# Patient Record
Sex: Female | Born: 1945 | Race: White | Hispanic: No | Marital: Married | Smoking: Former smoker
Health system: Southern US, Community
[De-identification: ages and names within clinical notes are randomized; demographics above are authoritative.]

## PROBLEM LIST (undated history)

## (undated) DIAGNOSIS — E079 Disorder of thyroid, unspecified: Secondary | ICD-10-CM

## (undated) DIAGNOSIS — E7800 Pure hypercholesterolemia, unspecified: Secondary | ICD-10-CM

## (undated) DIAGNOSIS — M810 Age-related osteoporosis without current pathological fracture: Secondary | ICD-10-CM

## (undated) HISTORY — PX: ABDOMINAL HYSTERECTOMY: SHX81

## (undated) HISTORY — PX: CHOLECYSTECTOMY: SHX55

## (undated) HISTORY — PX: PARATHYROIDECTOMY: SHX19

---

## 1997-12-25 ENCOUNTER — Other Ambulatory Visit: Admission: RE | Admit: 1997-12-25 | Discharge: 1997-12-25 | Payer: Self-pay | Admitting: Cardiology

## 1999-03-27 ENCOUNTER — Other Ambulatory Visit: Admission: RE | Admit: 1999-03-27 | Discharge: 1999-03-27 | Payer: Self-pay | Admitting: *Deleted

## 2000-01-13 ENCOUNTER — Encounter (INDEPENDENT_AMBULATORY_CARE_PROVIDER_SITE_OTHER): Payer: Self-pay | Admitting: Specialist

## 2000-01-13 ENCOUNTER — Other Ambulatory Visit: Admission: RE | Admit: 2000-01-13 | Discharge: 2000-01-13 | Payer: Self-pay | Admitting: Gastroenterology

## 2000-03-23 ENCOUNTER — Other Ambulatory Visit: Admission: RE | Admit: 2000-03-23 | Discharge: 2000-03-23 | Payer: Self-pay | Admitting: *Deleted

## 2001-03-21 ENCOUNTER — Other Ambulatory Visit: Admission: RE | Admit: 2001-03-21 | Discharge: 2001-03-21 | Payer: Self-pay | Admitting: *Deleted

## 2002-03-23 ENCOUNTER — Other Ambulatory Visit: Admission: RE | Admit: 2002-03-23 | Discharge: 2002-03-23 | Payer: Self-pay | Admitting: *Deleted

## 2003-04-17 ENCOUNTER — Other Ambulatory Visit: Admission: RE | Admit: 2003-04-17 | Discharge: 2003-04-17 | Payer: Self-pay | Admitting: *Deleted

## 2004-04-24 ENCOUNTER — Other Ambulatory Visit: Admission: RE | Admit: 2004-04-24 | Discharge: 2004-04-24 | Payer: Self-pay | Admitting: *Deleted

## 2004-12-23 ENCOUNTER — Ambulatory Visit: Payer: Self-pay | Admitting: Gastroenterology

## 2004-12-30 ENCOUNTER — Ambulatory Visit: Payer: Self-pay | Admitting: Gastroenterology

## 2004-12-30 ENCOUNTER — Encounter (INDEPENDENT_AMBULATORY_CARE_PROVIDER_SITE_OTHER): Payer: Self-pay | Admitting: Specialist

## 2005-04-27 ENCOUNTER — Other Ambulatory Visit: Admission: RE | Admit: 2005-04-27 | Discharge: 2005-04-27 | Payer: Self-pay | Admitting: *Deleted

## 2005-11-25 ENCOUNTER — Emergency Department (HOSPITAL_COMMUNITY): Admission: EM | Admit: 2005-11-25 | Discharge: 2005-11-25 | Payer: Self-pay | Admitting: Emergency Medicine

## 2006-04-27 ENCOUNTER — Other Ambulatory Visit: Admission: RE | Admit: 2006-04-27 | Discharge: 2006-04-27 | Payer: Self-pay | Admitting: *Deleted

## 2007-05-02 ENCOUNTER — Other Ambulatory Visit: Admission: RE | Admit: 2007-05-02 | Discharge: 2007-05-02 | Payer: Self-pay | Admitting: *Deleted

## 2008-05-02 ENCOUNTER — Other Ambulatory Visit: Admission: RE | Admit: 2008-05-02 | Discharge: 2008-05-02 | Payer: Self-pay | Admitting: Gynecology

## 2008-11-12 ENCOUNTER — Encounter (INDEPENDENT_AMBULATORY_CARE_PROVIDER_SITE_OTHER): Payer: Self-pay | Admitting: *Deleted

## 2009-05-06 ENCOUNTER — Other Ambulatory Visit: Admission: RE | Admit: 2009-05-06 | Discharge: 2009-05-06 | Payer: Self-pay | Admitting: Obstetrics and Gynecology

## 2009-08-15 ENCOUNTER — Telehealth (INDEPENDENT_AMBULATORY_CARE_PROVIDER_SITE_OTHER): Payer: Self-pay | Admitting: *Deleted

## 2009-08-29 ENCOUNTER — Telehealth (INDEPENDENT_AMBULATORY_CARE_PROVIDER_SITE_OTHER): Payer: Self-pay | Admitting: *Deleted

## 2009-09-05 ENCOUNTER — Emergency Department (HOSPITAL_COMMUNITY)
Admission: EM | Admit: 2009-09-05 | Discharge: 2009-09-05 | Payer: Self-pay | Source: Home / Self Care | Admitting: Emergency Medicine

## 2009-09-23 ENCOUNTER — Encounter (INDEPENDENT_AMBULATORY_CARE_PROVIDER_SITE_OTHER): Payer: Self-pay | Admitting: General Surgery

## 2009-09-23 ENCOUNTER — Ambulatory Visit (HOSPITAL_COMMUNITY): Admission: AD | Admit: 2009-09-23 | Discharge: 2009-09-24 | Payer: Self-pay | Admitting: General Surgery

## 2010-09-02 NOTE — Progress Notes (Signed)
Summary: Schedule Colonoscopy  Phone Note Outgoing Call Call back at [facility] Phone [phone]   Call placed by: Harlow Mares CMA Duncan Dull),  August 29, 2009 3:15 PM Call placed to: Patient Summary of Call: patient changed practices.  Initial call taken by: Harlow Mares CMA Duncan Dull),  August 29, 2009 3:16 PM

## 2010-09-02 NOTE — Progress Notes (Signed)
Summary: Medical Record release received   Medical Record release received from patient. Mailed 2006 and 2004 [employer] and Path reports to patient to [address] [place], [place] [address]. Wilder Glade  August 15, 2009 12:52 PM  Appended Document: Medical Record release received  address is [address] [place] [address]

## 2010-10-22 LAB — COMPREHENSIVE METABOLIC PANEL
Alkaline Phosphatase: 87 U/L (ref 39–117)
CO2: 28 mEq/L (ref 19–32)
Calcium: 9.8 mg/dL (ref 8.4–10.5)
Chloride: 103 mEq/L (ref 96–112)
GFR calc non Af Amer: >60 mL/min (ref >60)
Glucose, Bld: 183 mg/dL — ABNORMAL HIGH (ref 70–99)
Potassium: 4 mEq/L (ref 3.5–5.1)
Total Bilirubin: 0.2 mg/dL — ABNORMAL LOW (ref 0.3–1.2)

## 2010-10-22 LAB — URINALYSIS, ROUTINE W REFLEX MICROSCOPIC
Glucose, UA: NEGATIVE mg/dL
Hgb urine dipstick: NEGATIVE
pH: 6 (ref 5.0–8.0)

## 2010-10-22 LAB — CBC
Hemoglobin: 13.3 g/dL (ref 12.0–15.0)
Platelets: 227 10*3/uL (ref 150–400)
RDW: 13.4 % (ref 11.5–15.5)

## 2010-10-22 LAB — DIFFERENTIAL
Basophils Absolute: 0 10*3/uL (ref 0.0–0.1)
Basophils Relative: 0 % (ref 0–1)
Lymphs Abs: 1 10*3/uL (ref 0.7–4.0)
Neutrophils Relative %: 88 % — ABNORMAL HIGH (ref 43–77)

## 2010-10-23 LAB — CBC
MCHC: 34.3 g/dL (ref 30.0–36.0)
Platelets: 216 10*3/uL (ref 150–400)
RDW: 13.4 % (ref 11.5–15.5)

## 2010-10-23 LAB — DIFFERENTIAL
Basophils Relative: 1 % (ref 0–1)
Eosinophils Absolute: 0.1 10*3/uL (ref 0.0–0.7)
Eosinophils Relative: 2 % (ref 0–5)
Lymphocytes Relative: 28 % (ref 12–46)
Monocytes Absolute: 0.2 10*3/uL (ref 0.1–1.0)
Monocytes Relative: 5 % (ref 3–12)

## 2010-10-23 LAB — COMPREHENSIVE METABOLIC PANEL
BUN: 13 mg/dL (ref 6–23)
Chloride: 108 mEq/L (ref 96–112)
Creatinine, Ser: 0.9 mg/dL (ref 0.4–1.2)
GFR calc Af Amer: >60 mL/min (ref >60)
Glucose, Bld: 111 mg/dL — ABNORMAL HIGH (ref 70–99)
Potassium: 3.7 mEq/L (ref 3.5–5.1)
Sodium: 142 mEq/L (ref 135–145)

## 2013-05-18 ENCOUNTER — Other Ambulatory Visit (HOSPITAL_COMMUNITY): Payer: Self-pay | Admitting: Endocrinology

## 2013-05-23 ENCOUNTER — Encounter (HOSPITAL_COMMUNITY)
Admission: RE | Admit: 2013-05-23 | Discharge: 2013-05-23 | Disposition: A | Discharge status: Home / Self Care | Payer: PRIVATE HEALTH INSURANCE | Source: Ambulatory Visit | Attending: Endocrinology | Admitting: Endocrinology

## 2013-05-23 ENCOUNTER — Encounter (HOSPITAL_COMMUNITY): Payer: Self-pay

## 2013-05-23 HISTORY — DX: Hypercalcemia: E83.52

## 2013-05-23 MED ORDER — TECHNETIUM TC 99M SESTAMIBI - CARDIOLITE
25.0000 | Freq: Once | INTRAVENOUS | Status: AC | PRN
  Administered 2013-05-23: 25 via INTRAVENOUS

## 2014-02-16 ENCOUNTER — Encounter (HOSPITAL_COMMUNITY): Payer: Self-pay | Admitting: Emergency Medicine

## 2014-02-16 ENCOUNTER — Emergency Department (HOSPITAL_COMMUNITY)
Admission: EM | Admit: 2014-02-16 | Discharge: 2014-02-16 | Disposition: A | Discharge status: Home / Self Care | Payer: PRIVATE HEALTH INSURANCE | Attending: Emergency Medicine | Admitting: Emergency Medicine

## 2014-02-16 ENCOUNTER — Emergency Department (HOSPITAL_COMMUNITY): Payer: PRIVATE HEALTH INSURANCE

## 2014-02-16 DIAGNOSIS — Z862 Personal history of diseases of the blood and blood-forming organs and certain disorders involving the immune mechanism: Secondary | ICD-10-CM | POA: Insufficient documentation

## 2014-02-16 DIAGNOSIS — S61209A Unspecified open wound of unspecified finger without damage to nail, initial encounter: Secondary | ICD-10-CM | POA: Insufficient documentation

## 2014-02-16 DIAGNOSIS — Y929 Unspecified place or not applicable: Secondary | ICD-10-CM | POA: Insufficient documentation

## 2014-02-16 DIAGNOSIS — Y93E5 Activity, floor mopping and cleaning: Secondary | ICD-10-CM | POA: Insufficient documentation

## 2014-02-16 DIAGNOSIS — W268XXA Contact with other sharp object(s), not elsewhere classified, initial encounter: Secondary | ICD-10-CM | POA: Insufficient documentation

## 2014-02-16 DIAGNOSIS — Z8639 Personal history of other endocrine, nutritional and metabolic disease: Secondary | ICD-10-CM | POA: Insufficient documentation

## 2014-02-16 DIAGNOSIS — Z23 Encounter for immunization: Secondary | ICD-10-CM | POA: Insufficient documentation

## 2014-02-16 DIAGNOSIS — S61212A Laceration without foreign body of right middle finger without damage to nail, initial encounter: Secondary | ICD-10-CM

## 2014-02-16 HISTORY — DX: Disorder of thyroid, unspecified: E07.9

## 2014-02-16 HISTORY — DX: Pure hypercholesterolemia, unspecified: E78.00

## 2014-02-16 HISTORY — DX: Age-related osteoporosis without current pathological fracture: M81.0

## 2014-02-16 MED ORDER — TETANUS-DIPHTH-ACELL PERTUSSIS 5-2.5-18.5 LF-MCG/0.5 IM SUSP
0.5000 mL | Freq: Once | INTRAMUSCULAR | Status: AC
  Administered 2014-02-16: 0.5 mL via INTRAMUSCULAR
  Filled 2014-02-16: qty 0.5

## 2014-02-16 NOTE — ED Provider Notes (Signed)
Medical screening examination/treatment/procedure(s) were performed by non-physician practitioner and as supervising physician I was immediately available for consultation/collaboration.   EKG Interpretation None        Ellin Fitzgibbons N Mckenna Boruff, DO 02/16/14 2301 

## 2014-02-16 NOTE — ED Notes (Signed)
Pt reports that while cleaning today she picked up a glass lamp, which broke in her hands and caused a laceration to the right middle finger. Pt reports that she is unaware of her last tetanus vaccination. Pt is A/O x4, in NAD, and vitals are WDL.

## 2014-02-16 NOTE — ED Provider Notes (Signed)
CSN: [id number]     Arrival date & time 02/16/14  1648 History  This chart was scribed for Monica Beck, PA, working with Raelyn Number, DO by Chestine Spore, ED Scribe. The patient was seen in room WTR8/WTR8 at 5:08 PM.     Chief Complaint  Patient presents with  . Laceration     The history is provided by the patient. No language interpreter was used.   HPI Comments: Monica Farrell is a 68 y.o. female who presents to the Emergency Department complaining of a laceration to the middle finger of the right hand. She states that she was cleaning today and she picked up a glass lamp that broke in her hands. She states that it caused a laceration to her finger. She states that there is concern for any glass that may be inside the laceration. She states that she also iced the laceration. She denies numbness or any other associated symptoms. She states that she is not clear on when her last tetanus vaccine was.     Past Medical History  Diagnosis Date  . Hypercalcemia    History reviewed. No pertinent past surgical history. No family history on file. History  Substance Use Topics  . Smoking status: Not on file  . Smokeless tobacco: Not on file  . Alcohol Use: Not on file   OB History   Grav Para Term Preterm Abortions TAB SAB Ect Mult Living                 Review of Systems  Skin: Positive for wound.  All other systems reviewed and are negative.    Allergies  Review of patient's allergies indicates not on file.  Home Medications   Prior to Admission medications   Not on File   BP 131/78  Pulse 93  Temp(Src) 98.4 F (36.9 C) (Oral)  Resp 18  SpO2 98%  Physical Exam  Nursing note and vitals reviewed. Constitutional: She is oriented to person, place, and time. She appears well-developed and well-nourished. No distress.  HENT:  Head: Normocephalic and atraumatic.  Eyes: EOM are normal.  Neck: Neck supple. No tracheal deviation present.  Cardiovascular: Normal  rate.   Pulmonary/Chest: Effort normal. No respiratory distress.  Musculoskeletal: Normal range of motion.  Neurological: She is alert and oriented to person, place, and time.  Skin: Skin is warm and dry.  .5 cm laceration to lateral right middle finger with no bleeding at this time. Full ROM of the affected finger and full strength and distal sensation intact  Psychiatric: She has a normal mood and affect. Her behavior is normal.    ED Course  Procedures (including critical care time) DIAGNOSTIC STUDIES: Oxygen Saturation is 98% on room air, normal by my interpretation.    COORDINATION OF CARE: 5:10 PM-Discussed treatment plan which includes Dermabond, Tdap injection, and a X-Ray with pt at bedside and pt agreed to plan.   LACERATION REPAIR Performed by: Monica Farrell Authorized by: Monica Farrell Consent: Verbal consent obtained. Risks and benefits: risks, benefits and alternatives were discussed Consent given by: patient Patient identity confirmed: provided demographic data Prepped and Draped in normal sterile fashion Wound explored  Laceration Location: right middle finger  Laceration Length: 0.5 cm  No Foreign Bodies seen or palpated  Anesthesia: none  Irrigation method: syringe Amount of cleaning: standard  Skin closure: dermabond  Number of sutures: n/a  Technique: n/a  Patient tolerance: Patient tolerated the procedure well with no immediate complications.   Labs  Review Labs Reviewed - No data to display  Imaging Review No results found.   EKG Interpretation None      MDM   Final diagnoses:  Laceration of right middle finger w/o foreign body w/o damage to nail, initial encounter    6:11 PM Patient's laceration repaired without difficulty. Patient instructed to return with worsening or concerning symptoms. No neurovascular compromise.   I personally performed the services described in this documentation, which was scribed in my  presence. The recorded information has been reviewed and is accurate.    Monica BeckKaitlyn Takita Riecke, PA-C 02/16/14 1812

## 2014-02-16 NOTE — Discharge Instructions (Signed)
Keep wound area clean. Do not pick at or scrub the dermabond. Return to the ED with worsening or concerning symptoms.

## 2018-09-13 ENCOUNTER — Other Ambulatory Visit: Payer: Self-pay | Admitting: Endocrinology

## 2018-09-13 DIAGNOSIS — Z1231 Encounter for screening mammogram for malignant neoplasm of breast: Secondary | ICD-10-CM

## 2018-10-12 ENCOUNTER — Ambulatory Visit
Admission: RE | Admit: 2018-10-12 | Discharge: 2018-10-12 | Disposition: A | Discharge status: Home / Self Care | Payer: Medicare Other | Source: Ambulatory Visit | Attending: Endocrinology | Admitting: Endocrinology

## 2018-10-12 ENCOUNTER — Other Ambulatory Visit: Payer: Self-pay

## 2018-10-12 DIAGNOSIS — Z1231 Encounter for screening mammogram for malignant neoplasm of breast: Secondary | ICD-10-CM

## 2019-06-30 IMAGING — MG DIGITAL SCREENING BILATERAL MAMMOGRAM WITH TOMO AND CAD
8 series · 9 of 24 positions shown · non-contrast
Comparison: Previous exam(s).

CLINICAL DATA: Screening.

EXAM:
DIGITAL SCREENING BILATERAL MAMMOGRAM WITH TOMO AND CAD

[R CC synth-2D]
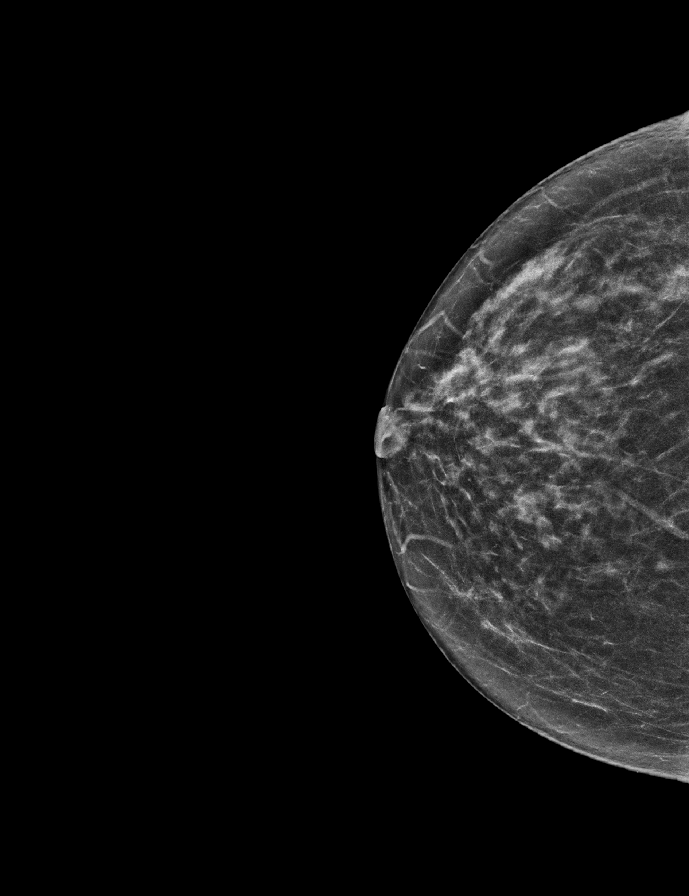

[L MLO synth-2D]
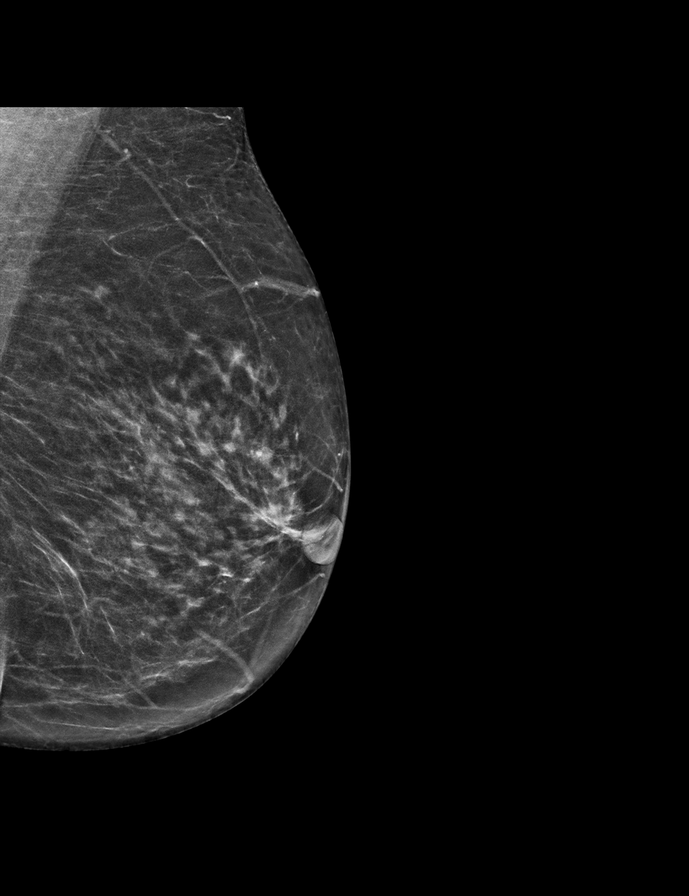

[R MLO synth-2D]
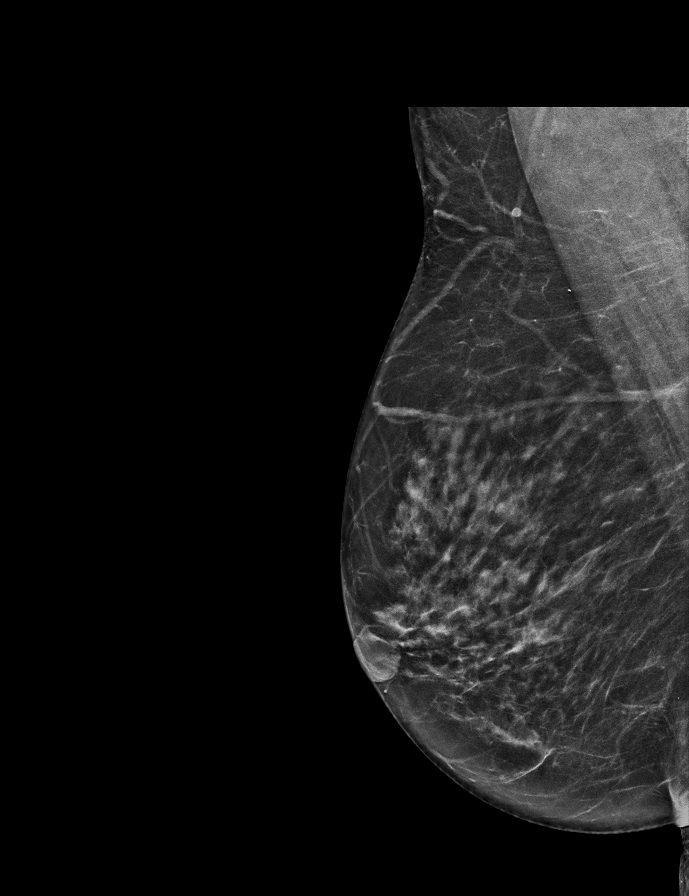

[L CC synth-2D]
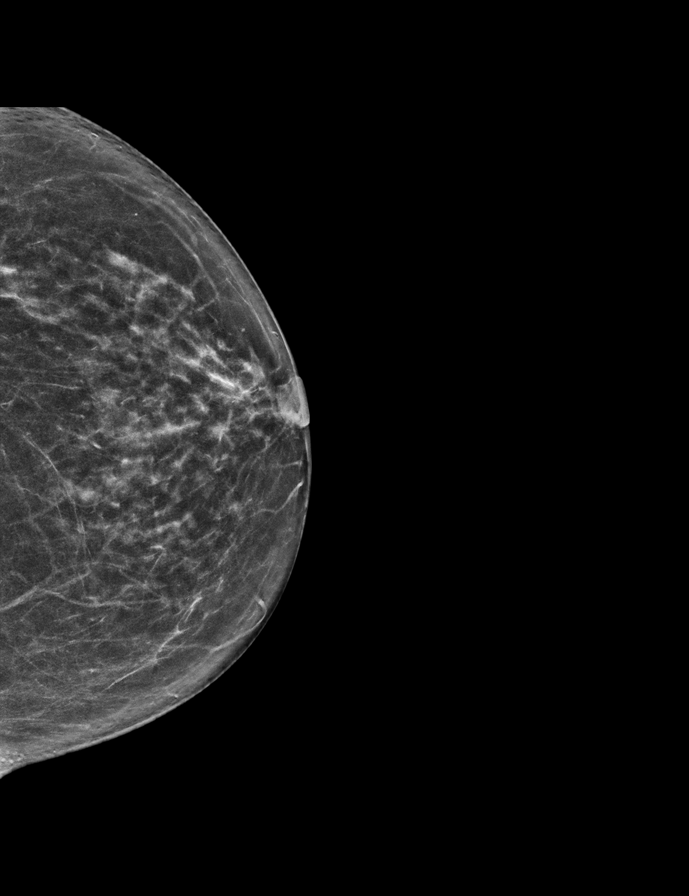

[R MLO tomo · 2 of 61 frames shown]
[frame 20/61]
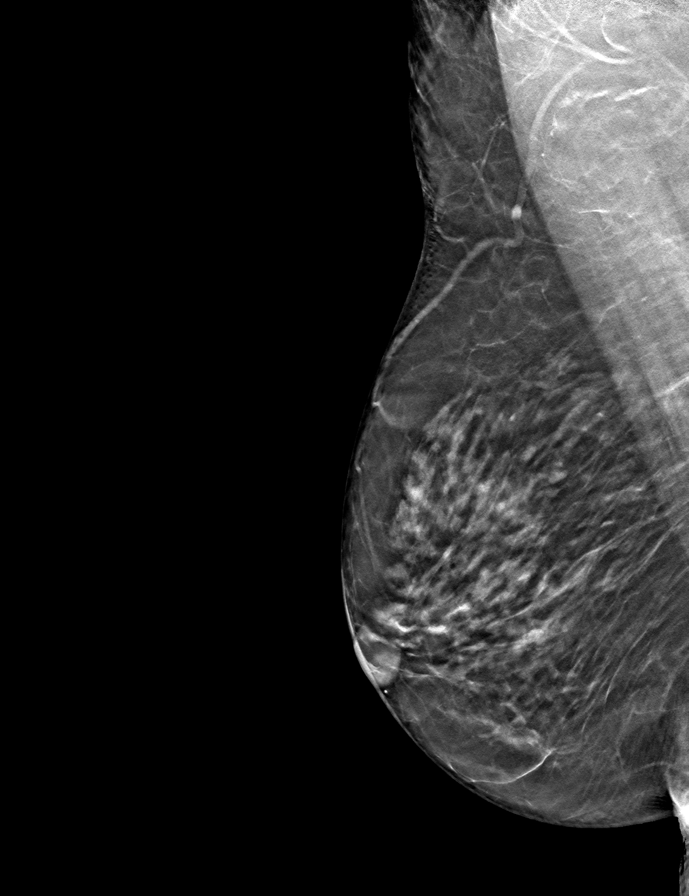
[frame 31/61]
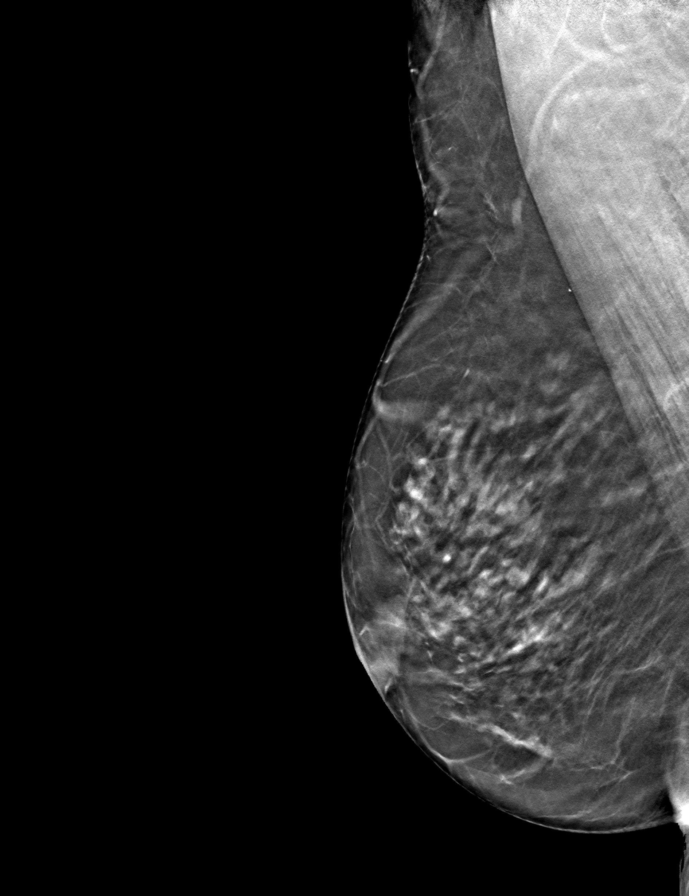

[R CC tomo · tomo slice 28/55.0]
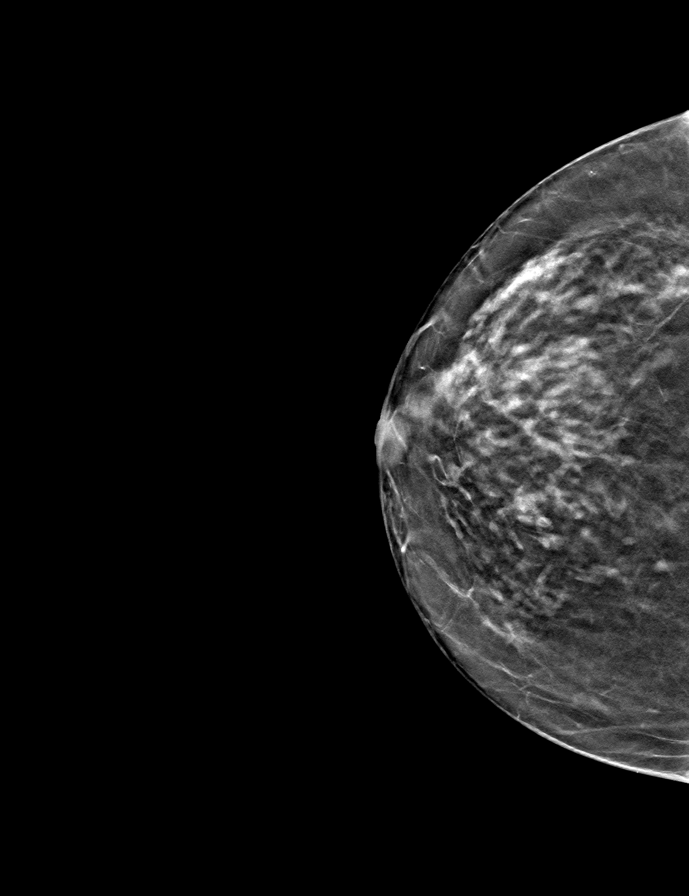

[L MLO tomo · tomo slice 30/59.0]
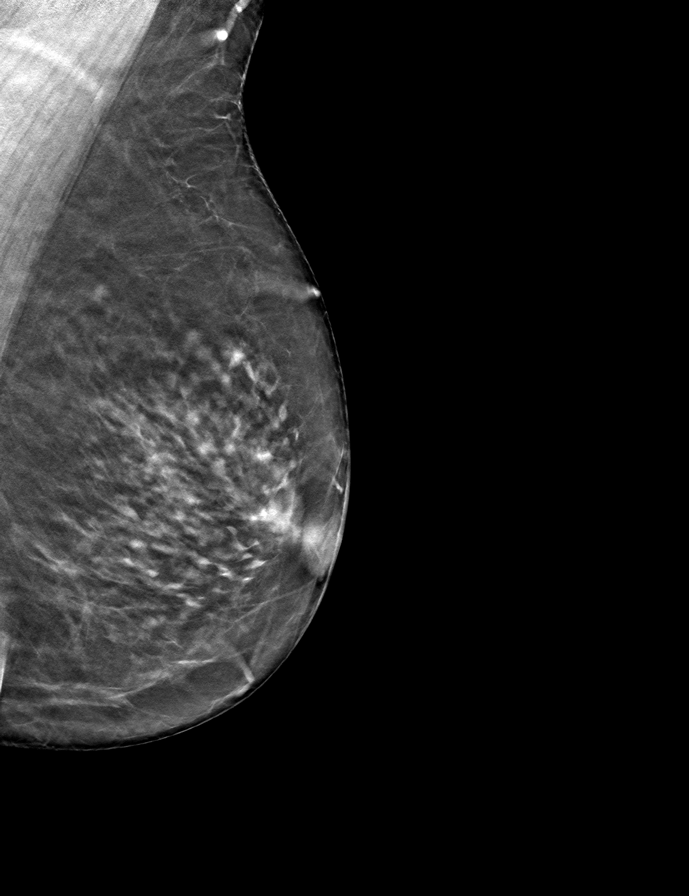

[L CC tomo · tomo slice 29/58.0]
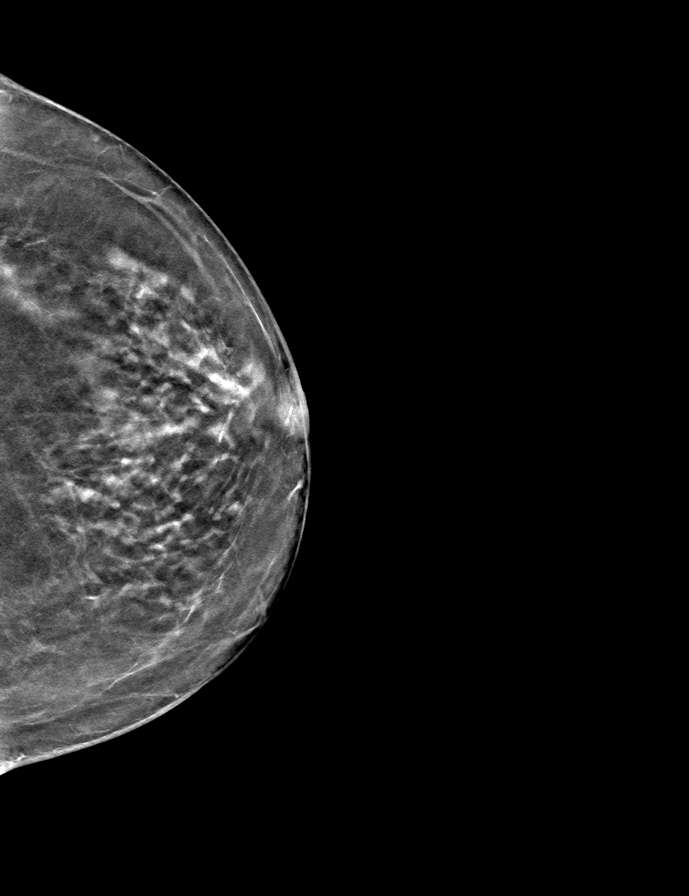

[9 of 24 positions shown; findings below may reference images not displayed]

ACR Breast Density Category c: The breast tissue is heterogeneously
dense, which may obscure small masses.
FINDINGS: There are no findings suspicious for malignancy. Images were
processed with CAD.
IMPRESSION: No mammographic evidence of malignancy. A result letter of this
screening mammogram will be mailed directly to the patient.

RECOMMENDATION:
Screening mammogram in one year. (Code:[id number])

BI-RADS CATEGORY  1: Negative.

## 2019-08-14 ENCOUNTER — Encounter (HOSPITAL_BASED_OUTPATIENT_CLINIC_OR_DEPARTMENT_OTHER): Payer: Self-pay

## 2019-08-14 ENCOUNTER — Emergency Department (HOSPITAL_BASED_OUTPATIENT_CLINIC_OR_DEPARTMENT_OTHER)
Admission: EM | Admit: 2019-08-14 | Discharge: 2019-08-14 | Disposition: A | Discharge status: Home / Self Care | Payer: Medicare Other | Attending: Emergency Medicine | Admitting: Emergency Medicine

## 2019-08-14 ENCOUNTER — Other Ambulatory Visit: Payer: Self-pay

## 2019-08-14 ENCOUNTER — Emergency Department (HOSPITAL_BASED_OUTPATIENT_CLINIC_OR_DEPARTMENT_OTHER): Payer: Medicare Other

## 2019-08-14 DIAGNOSIS — E782 Mixed hyperlipidemia: Secondary | ICD-10-CM | POA: Diagnosis not present

## 2019-08-14 DIAGNOSIS — Z87891 Personal history of nicotine dependence: Secondary | ICD-10-CM | POA: Insufficient documentation

## 2019-08-14 DIAGNOSIS — J4521 Mild intermittent asthma with (acute) exacerbation: Secondary | ICD-10-CM | POA: Diagnosis not present

## 2019-08-14 DIAGNOSIS — Z880 Allergy status to penicillin: Secondary | ICD-10-CM | POA: Insufficient documentation

## 2019-08-14 DIAGNOSIS — Z882 Allergy status to sulfonamides status: Secondary | ICD-10-CM | POA: Diagnosis not present

## 2019-08-14 DIAGNOSIS — E079 Disorder of thyroid, unspecified: Secondary | ICD-10-CM | POA: Diagnosis not present

## 2019-08-14 DIAGNOSIS — Z881 Allergy status to other antibiotic agents status: Secondary | ICD-10-CM | POA: Insufficient documentation

## 2019-08-14 DIAGNOSIS — Z885 Allergy status to narcotic agent status: Secondary | ICD-10-CM | POA: Insufficient documentation

## 2019-08-14 DIAGNOSIS — Z79899 Other long term (current) drug therapy: Secondary | ICD-10-CM | POA: Diagnosis not present

## 2019-08-14 DIAGNOSIS — R0602 Shortness of breath: Secondary | ICD-10-CM

## 2019-08-14 DIAGNOSIS — Z20822 Contact with and (suspected) exposure to covid-19: Secondary | ICD-10-CM | POA: Insufficient documentation

## 2019-08-14 LAB — CBC WITH DIFFERENTIAL/PLATELET
Abs Immature Granulocytes: 0.01 10*3/uL (ref 0.00–0.07)
Basophils Absolute: 0.1 10*3/uL (ref 0.0–0.1)
Basophils Relative: 1 %
Eosinophils Absolute: 0.4 10*3/uL (ref 0.0–0.5)
Eosinophils Relative: 6 %
HCT: 41.9 % (ref 36.0–46.0)
Hemoglobin: 13.4 g/dL (ref 12.0–15.0)
Immature Granulocytes: 0 %
Lymphocytes Relative: 16 %
Lymphs Abs: 1 10*3/uL (ref 0.7–4.0)
MCH: 28.9 pg (ref 26.0–34.0)
MCHC: 32 g/dL (ref 30.0–36.0)
MCV: 90.3 fL (ref 80.0–100.0)
Monocytes Absolute: 0.4 10*3/uL (ref 0.1–1.0)
Monocytes Relative: 7 %
Neutro Abs: 4.2 10*3/uL (ref 1.7–7.7)
Neutrophils Relative %: 70 %
Platelets: 230 10*3/uL (ref 150–400)
RBC: 4.64 MIL/uL (ref 3.87–5.11)
RDW: 13.2 % (ref 11.5–15.5)
WBC: 6 10*3/uL (ref 4.0–10.5)
nRBC: 0 % (ref 0.0–0.2)

## 2019-08-14 LAB — COMPREHENSIVE METABOLIC PANEL
ALT: 33 U/L (ref 0–44)
AST: 31 U/L (ref 15–41)
Albumin: 3.7 g/dL (ref 3.5–5.0)
Alkaline Phosphatase: 42 U/L (ref 38–126)
Anion gap: 8 (ref 5–15)
BUN: 12 mg/dL (ref 8–23)
CO2: 27 mmol/L (ref 22–32)
Calcium: 9.3 mg/dL (ref 8.9–10.3)
Chloride: 105 mmol/L (ref 98–111)
Creatinine, Ser: 0.66 mg/dL (ref 0.44–1.00)
GFR calc Af Amer: >60 mL/min (ref >60)
GFR calc non Af Amer: >60 mL/min (ref >60)
Glucose, Bld: 111 mg/dL — ABNORMAL HIGH (ref 70–99)
Potassium: 3.5 mmol/L (ref 3.5–5.1)
Sodium: 140 mmol/L (ref 135–145)
Total Bilirubin: 0.9 mg/dL (ref 0.3–1.2)
Total Protein: 6.6 g/dL (ref 6.5–8.1)

## 2019-08-14 LAB — TROPONIN I (HIGH SENSITIVITY)
Troponin I (High Sensitivity): 3 ng/L (ref <18)
Troponin I (High Sensitivity): 3 ng/L (ref <18)

## 2019-08-14 LAB — SARS CORONAVIRUS 2 AG (30 MIN TAT): SARS Coronavirus 2 Ag: NEGATIVE

## 2019-08-14 LAB — BRAIN NATRIURETIC PEPTIDE: B Natriuretic Peptide: 23.2 pg/mL (ref 0.0–100.0)

## 2019-08-14 MED ORDER — PREDNISONE 20 MG PO TABS: 40.0000 mg | ORAL_TABLET | Freq: Every day | ORAL | 0 refills | Status: DC

## 2019-08-14 MED ORDER — PREDNISONE 20 MG PO TABS: 40.0000 mg | ORAL_TABLET | Freq: Every day | ORAL | 0 refills | Status: AC

## 2019-08-14 MED ORDER — METHYLPREDNISOLONE SODIUM SUCC 125 MG IJ SOLR
125.0000 mg | Freq: Once | INTRAMUSCULAR | Status: AC
  Administered 2019-08-14: 125 mg via INTRAVENOUS
  Filled 2019-08-14: qty 2

## 2019-08-14 MED ORDER — DEXAMETHASONE SODIUM PHOSPHATE 10 MG/ML IJ SOLN
8.0000 mg | Freq: Once | INTRAMUSCULAR | Status: DC
  Filled 2019-08-14: qty 1

## 2019-08-14 MED ORDER — MAGNESIUM SULFATE 2 GM/50ML IV SOLN
2.0000 g | Freq: Once | INTRAVENOUS | Status: AC
  Administered 2019-08-14: 2 g via INTRAVENOUS
  Filled 2019-08-14: qty 50

## 2019-08-14 NOTE — ED Notes (Signed)
Spoke with Pt. Husband and changed the pharmacy for the Pt. meds and will DC Pt. At this time.

## 2019-08-14 NOTE — ED Provider Notes (Signed)
[facility] EMERGENCY DEPARTMENT Provider Note   CSN: [id number] Arrival date & time: 08/14/19  1036     History Chief Complaint  Patient presents with  . Shortness of Breath    Monica Farrell is a 74 y.o. female.  74 y.o female with a PMH of Hypercalcemia, Osteoporosis, Thyroid disease presents to the ED with a chief complaint of cough x 5 days. Patient endorses a wet cough for the past 5 days with yellow sputum.  She reports having an ED visit with her PCP Dr. Lindajo Royal who recommended albuterol inhaler along with Mucinex.  She has been taking her albuterol inhaler more frequently bid.  She also has been taking Mucinex around-the-clock but reports no improvement in symptoms.  She reports her symptoms worsen at night, she feels like her chest is sore from all the coughing.  She reports this is better with sitting up, has been sleeping sitting up.She was diagnosed with covid 19 on 06/19/2019, she reports her sense of taste and smell returned but then left again.  She denies any fever, chest pain, sore throat, abdominal pain. No prior history of CAD, blood clots.   The history is provided by the patient.  Shortness of Breath Associated symptoms: no abdominal pain, no chest pain, no fever, no headaches, no sore throat and no vomiting        Past Medical History:  Diagnosis Date  . Hypercalcemia   . Hypercholesteremia   . Osteoporosis   . Thyroid disease     There are no problems to display for this patient.   Past Surgical History:  Procedure Laterality Date  . ABDOMINAL HYSTERECTOMY    . CHOLECYSTECTOMY    . PARATHYROIDECTOMY       OB History   No obstetric history on file.     History reviewed. No pertinent family history.  Social History   Tobacco Use  . Smoking status: Former Smoker    Packs/day: 0.25    Years: 15.00    Pack years: 3.75    Types: Cigarettes    Quit date: 08/03/1990    Years since quitting: 29.0  . Smokeless tobacco: Never Used    Substance Use Topics  . Alcohol use: No  . Drug use: No    Home Medications Prior to Admission medications   Medication Sig Start Date End Date Taking? Authorizing Provider  calcium carbonate (OS-CAL - DOSED IN MG OF ELEMENTAL CALCIUM) 1250 MG tablet Take 1 tablet by mouth daily with breakfast.    [provider]  denosumab (PROLIA) 60 MG/ML SOLN injection Inject 60 mg into the skin every 6 (six) months. Administer in upper arm, thigh, or abdomen    [provider]  levothyroxine (SYNTHROID, LEVOTHROID) 88 MCG tablet Take 88 mcg by mouth daily before breakfast.    [provider]  Omega-3 Fatty Acids (OMEGA 3 PO) Take 3 tablets by mouth daily with breakfast.    [provider]  pioglitazone (ACTOS) 30 MG tablet Take 30 mg by mouth at bedtime.    [provider]  Polyvinyl Alcohol-Povidone (MURINE TEARS FOR DRY EYES) 5-6 MG/ML SOLN Place 1 drop into both eyes 2 (two) times daily.    [provider]  predniSONE (DELTASONE) 20 MG tablet Take 2 tablets (40 mg total) by mouth daily for 5 days. 08/14/19 08/19/19  Claude Manges, PA-C  simvastatin (ZOCOR) 40 MG tablet Take 40 mg by mouth every evening.    [provider]    Allergies  Codeine, Hydrocodone, Levaquin [levofloxacin in d5w], Penicillins, and Sulfa antibiotics  Review of Systems   Review of Systems  Constitutional: Negative for fever.  HENT: Negative for rhinorrhea and sore throat.   Respiratory: Positive for chest tightness and shortness of breath.   Cardiovascular: Negative for chest pain, palpitations and leg swelling.  Gastrointestinal: Negative for abdominal pain, nausea and vomiting.  Genitourinary: Negative for flank pain.  Musculoskeletal: Negative for back pain.  Skin: Negative for pallor and wound.  Neurological: Negative for light-headedness and headaches.    Physical Exam Updated Vital Signs BP (!) 152/87 (BP Location: Right Arm)   Pulse 94   Temp  98.5 F (36.9 C) (Oral)   Resp 18   Ht 5\' 5"  (1.651 m)   Wt 74.4 kg   SpO2 99%   BMI 27.29 kg/m   Physical Exam Vitals and nursing note reviewed.  Constitutional:      General: She is not in acute distress.    Appearance: She is well-developed.  HENT:     Head: Normocephalic and atraumatic.     Mouth/Throat:     Pharynx: No oropharyngeal exudate.  Eyes:     Pupils: Pupils are equal, round, and reactive to light.  Cardiovascular:     Rate and Rhythm: Regular rhythm.     Heart sounds: Normal heart sounds.     Comments: No pitting edema BL, no calf tenderness.  Pulmonary:     Effort: Pulmonary effort is normal. No respiratory distress.     Breath sounds: Examination of the left-upper field reveals decreased breath sounds. Examination of the left-lower field reveals decreased breath sounds. Decreased breath sounds and wheezing present.  Chest:     Chest wall: No tenderness.  Abdominal:     General: Bowel sounds are normal. There is no distension.     Palpations: Abdomen is soft.     Tenderness: There is no abdominal tenderness.     Comments: Soft, without tenderness to palpation. Bowel sounds are normal.   Musculoskeletal:        General: No tenderness or deformity.     Cervical back: Normal range of motion.     Right lower leg: No edema.     Left lower leg: No edema.  Skin:    General: Skin is warm and dry.  Neurological:     Mental Status: She is alert and oriented to person, place, and time.     ED Results / Procedures / Treatments   Labs (all labs ordered are listed, but only abnormal results are displayed) Labs Reviewed  COMPREHENSIVE METABOLIC PANEL - Abnormal; Notable for the following components:      Result Value   Glucose, Bld 111 (*)    All other components within normal limits  SARS CORONAVIRUS 2 AG (30 MIN TAT)  CBC WITH DIFFERENTIAL/PLATELET  BRAIN NATRIURETIC PEPTIDE  TROPONIN I (HIGH SENSITIVITY)  TROPONIN I (HIGH SENSITIVITY)    EKG EKG  Interpretation  Date/Time:  Monday August 14 2019 10:56:13 EST Ventricular Rate:  87 PR Interval:    QRS Duration: 87 QT Interval:  366 QTC Calculation: 441 R Axis:   56 Text Interpretation: Sinus rhythm Confirmed by 08[birth year]1995 [phone]) on 08/14/2019 11:16:26 AM   Radiology DG Chest Portable 1 View  Result Date: 08/14/2019 CLINICAL DATA:  Shortness of breath, congestion, wheezing, had COVID 56 days ago EXAM: PORTABLE CHEST 1 VIEW COMPARISON:  Portable exam 1040 hours compared to 09/30/2018 FINDINGS: Normal heart size, mediastinal contours, and pulmonary  vascularity. Lungs clear. No pulmonary infiltrate, pleural effusion, or pneumothorax. Bones unremarkable. IMPRESSION: No acute abnormalities. Electronically Signed   By: Ulyses Southward M.D.   On: 08/14/2019 11:10    Procedures Procedures (including critical care time)  Medications Ordered in ED Medications  magnesium sulfate IVPB 2 g 50 mL (0 g Intravenous Stopped 08/14/19 1352)  methylPREDNISolone sodium succinate (SOLU-MEDROL) 125 mg/2 mL injection 125 mg (125 mg Intravenous Given 08/14/19 1151)    ED Course  I have reviewed the triage vital signs and the nursing notes.  Pertinent labs & imaging results that were available during my care of the patient were reviewed by me and considered in my medical decision making (see chart for details).  Clinical Course as of Aug 13 1432  Mon Aug 14, 2019  1430 SARS Coronavirus 2 Ag: NEGATIVE [JS]  1431 Troponin I (High Sensitivity): 3 [JS]  1432 B Natriuretic Peptide: 23.2 [JS]    Clinical Course User Index [JS] Claude Manges, PA-C   MDM Rules/Calculators/A&P   Patient with a past medical history of asthma presents to the ED with complains of cough for the past 5 days.  She was evaluated by her PCP via telehealth and advised to increase her albuterol use along with take Mucinex.  She reports her symptoms have worsened, she reports multiple coughing fits at night, yellow sputum was  present.  She is had no fevers, chills, tested positive for COVID-19 in the month of November, she reports her symptoms resolve however they have returned now she does not have any taste or smell.  She is unsure whether she has tried to [employer] again.  She reports symptoms are alleviated with sitting up, does not have a prior history of CHF.  My evaluation there is significant wheezing to the left upper and lower lung fields, no tachypnea present, patient satting at 87% on room air without any tachycardia.  She denies any prior history of CAD.  Non-smoker.  Present are noted including but not limited to asthma exacerbation versus Covid infection versus reactive airway disease.  CMP without electrolyte normalities.  Creatinine level is within normal limits.  LFTs are unremarkable.  CBC without any leukocytosis.  BNP is normal.  Troponin x2 have been within normal range.  Her rapid Covid test is negative on today's visit. Lower suspicion for ACS, patient denies any chest pain on today's visit, tropes are within normal limits.  EKG without any changes in ST segments or changes consistent with infarct.  X-ray without intensive consolation, nocturnal, pleural effusion.  Her BNP is normal, no signs of heart failure.  He was treated in the ED with magnesium, Solu-Medrol.  2:33 PM Patient was reevaluated by me, she reports improvement in her symptoms.  Wheezing has improved.  Her vitals are within normal limits, she is satting at 99% on room air.  We discussed going home on a short course of steroid burst, patient is agreeable to treatment.  She will also continue her albuterol treatment.  Patient understands and agrees with management, return precautions discussed at length.   Portions of this note were generated with Scientist, clinical (histocompatibility and immunogenetics). Dictation errors may occur despite best attempts at proofreading.  Final Clinical Impression(s) / ED Diagnoses Final diagnoses:  Shortness of breath  Mild intermittent  asthma with exacerbation    Rx / DC Orders ED Discharge Orders         Ordered    predniSONE (DELTASONE) 20 MG tablet  Daily     08/14/19  [place], Montrice Gracey, PA-C 08/14/19 1434    Lennice Sites, DO 08/14/19 1504

## 2019-08-14 NOTE — ED Notes (Signed)
C/o cough and shortness of breath onset 5 days ago  Denies pain

## 2019-08-14 NOTE — Discharge Instructions (Addendum)
Your chest x-ray today was clear.  Your COVID-19 result was negative.  I prescribed a short course of steroids to help with your symptoms, please take 2 tablets daily for the next 5 days.  Please be aware this medication can cause flushness, changes in appetite, insomnia.  Follow-up with your primary care physician in 1 week.

## 2019-08-14 NOTE — ED Provider Notes (Signed)
Medical screening examination/treatment/procedure(s) were conducted as a shared visit with non-physician practitioner(s) and myself.  I personally evaluated the patient during the encounter. Briefly, the patient is a 74 y.o. female with history of asthma, recent coronavirus, high cholesterol who presents to the ED with shortness of breath.  Patient with wheezing on exam but overall appears comfortable.  Normal vitals.  No hypoxia.  Chest x-ray shows no signs of infection.  EKG shows sinus rhythm.  Does not have any chest pain.  Will get lab work including troponin.  Does not appear to be volume overloaded.  Suspect asthma exacerbation.  Appears mild.  Will give Solu-Medrol, magnesium and reevaluate.  Anticipate discharge to home with steroids and further breathing treatments for likely reactive airway process.   EKG Interpretation  Date/Time:  Monday August 14 2019 10:56:13 EST Ventricular Rate:  87 PR Interval:    QRS Duration: 87 QT Interval:  366 QTC Calculation: 441 R Axis:   56 Text Interpretation: Sinus rhythm Confirmed by Virgina Norfolk [phone]) on 08/14/2019 11:16:26 AM           Virgina Norfolk, DO 08/14/19 1146

## 2019-08-14 NOTE — ED Notes (Signed)
Spoke to Pt. Husband on phone several times during pt. Stay.  Sam RN spoke to Pt. Husband during Pt. Stay several times.

## 2019-08-14 NOTE — ED Triage Notes (Signed)
Pt brought to ED by husband for SOB, congestion and wheezing. Pt reports she has Covid 56 days ago.

## 2024-05-03 NOTE — Telephone Encounter (Signed)
 Copied from CRM #[id number]. Topic: Schedule Appointment - Schedule Patient >> May 03, 2024  2:25 PM Clotilda MATSU wrote: Bennett, [facility] is calling other request    Include all details related to the request(s) below:  Patient is requesting an appointment for retaining fluid, sleeping well, legs swelling, chest swelling. 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 PCP does not have anything until Oct 22,2025. Message sent to front desk.   Confirm and type the Best Contact Number below:  Patient/caller contact number:      [phone]       [] Home  [] Mobile  [] Work [] Other   [] Okay to leave a voicemail   Medication List:  Current Outpatient Medications:  .  albuterol HFA (PROVENTIL HFA;VENTOLIN HFA;PROAIR HFA) 90 mcg/actuation inhaler, Inhale 2 puffs every 6 (six) hours as needed for wheezing for up to 30 days., Disp: 1 each, Rfl: 11 .  cholecalciferol (VITAMIN D3) 1,250 mcg (50,000 unit) capsule, Take 1 each (50,000 Units total) by mouth once a week., Disp: 12 capsule, Rfl: 0 .  cholecalciferol (VITAMIN D3) 2,000 unit cap capsule, Take 1 capsule by mouth Once Daily., Disp: , Rfl:  .  denosumab (Prolia) 60 mg/mL syrg syringe, Inject 60 mg under the skin every 6 (six) months., Disp: , Rfl:  .  fluticasone HFA (FLOVENT HFA) 44 mcg/actuation inhaler, Inhale 1 puff 2 (two) times a day., Disp: 1 each, Rfl: 11 .  levothyroxine (SYNTHROID) 88 mcg tablet, Take one tab po daily except take 1/2 tab on Friday, Saturday and sunday, Disp: 75 tablet, Rfl: 3 .  omega 3-dha-epa-fish oil 1,200 (144-216) mg cap, Take 1,200 mg by mouth Once Daily., Disp: , Rfl:  .  omeprazole (PriLOSEC) 40 mg DR capsule, TAKE 1 CAPSULE (40 MG TOTAL) BY MOUTH EVERY EVENING., Disp: 90 capsule, Rfl: 3 .  simvastatin (ZOCOR) 40 mg tablet, TAKE 1 TABLET BY MOUTH EVERY DAY, Disp: 90 tablet, Rfl: 2  Current Facility-Administered Medications:  .  denosumab (PROLIA) syringe 60 mg, 60 mg, subcutaneous, Q6 Months, Elsie Zachary Claudene Mickey.,  MD, 60 mg at 12/23/23 1430     Medication Request/Refills: Pharmacy Information (if applicable)   [] Not Applicable       []  Pharmacy listed  Send Medication Request to:                                                 [] Pharmacy not listed (added to pharmacy list in Epic) Send Medication Request to:      Listed Pharmacies: [id number] - [place], [place] - [address] - PHONE: [phone] - FAX: [phone]
